# Patient Record
Sex: Female | Born: 1970 | Race: White | Hispanic: No | Marital: Married | Smoking: Former smoker
Health system: Southern US, Community
[De-identification: ages and names within clinical notes are randomized; demographics above are authoritative.]

## PROBLEM LIST (undated history)

## (undated) DIAGNOSIS — B379 Candidiasis, unspecified: Secondary | ICD-10-CM

## (undated) DIAGNOSIS — B1920 Unspecified viral hepatitis C without hepatic coma: Secondary | ICD-10-CM

## (undated) DIAGNOSIS — J020 Streptococcal pharyngitis: Secondary | ICD-10-CM

## (undated) DIAGNOSIS — N390 Urinary tract infection, site not specified: Secondary | ICD-10-CM

## (undated) HISTORY — PX: LEEP: SHX91

## (undated) HISTORY — PX: OTHER SURGICAL HISTORY: SHX169

## (undated) HISTORY — PX: GYNECOLOGIC CRYOSURGERY: SHX857

---

## 2017-12-03 ENCOUNTER — Other Ambulatory Visit: Payer: Self-pay

## 2017-12-03 ENCOUNTER — Emergency Department (HOSPITAL_COMMUNITY): Payer: Medicare Other

## 2017-12-03 ENCOUNTER — Encounter (HOSPITAL_COMMUNITY): Payer: Self-pay | Admitting: Emergency Medicine

## 2017-12-03 ENCOUNTER — Emergency Department (HOSPITAL_COMMUNITY)
Admission: EM | Admit: 2017-12-03 | Discharge: 2017-12-03 | Disposition: A | Discharge status: Home / Self Care | Payer: Medicare Other | Attending: Emergency Medicine | Admitting: Emergency Medicine

## 2017-12-03 DIAGNOSIS — R1012 Left upper quadrant pain: Secondary | ICD-10-CM | POA: Diagnosis not present

## 2017-12-03 DIAGNOSIS — Z87891 Personal history of nicotine dependence: Secondary | ICD-10-CM | POA: Insufficient documentation

## 2017-12-03 DIAGNOSIS — R112 Nausea with vomiting, unspecified: Secondary | ICD-10-CM | POA: Diagnosis not present

## 2017-12-03 DIAGNOSIS — R1013 Epigastric pain: Secondary | ICD-10-CM

## 2017-12-03 DIAGNOSIS — Z91040 Latex allergy status: Secondary | ICD-10-CM | POA: Diagnosis not present

## 2017-12-03 DIAGNOSIS — R1310 Dysphagia, unspecified: Secondary | ICD-10-CM | POA: Diagnosis not present

## 2017-12-03 DIAGNOSIS — R1011 Right upper quadrant pain: Secondary | ICD-10-CM | POA: Diagnosis not present

## 2017-12-03 HISTORY — DX: Candidiasis, unspecified: B37.9

## 2017-12-03 HISTORY — DX: Unspecified viral hepatitis C without hepatic coma: B19.20

## 2017-12-03 HISTORY — DX: Streptococcal pharyngitis: J02.0

## 2017-12-03 HISTORY — DX: Urinary tract infection, site not specified: N39.0

## 2017-12-03 LAB — CBC WITH DIFFERENTIAL/PLATELET
Abs Immature Granulocytes: 0 10*3/uL (ref 0.00–0.07)
Basophils Absolute: 0 10*3/uL (ref 0.0–0.1)
Basophils Relative: 1 %
EOS PCT: 2 %
Eosinophils Absolute: 0.1 10*3/uL (ref 0.0–0.5)
HEMATOCRIT: 32.2 % — AB (ref 36.0–46.0)
HEMOGLOBIN: 9.4 g/dL — AB (ref 12.0–15.0)
Immature Granulocytes: 0 %
LYMPHS PCT: 42 %
Lymphs Abs: 1.4 10*3/uL (ref 0.7–4.0)
MCH: 22.9 pg — AB (ref 26.0–34.0)
MCHC: 29.2 g/dL — AB (ref 30.0–36.0)
MCV: 78.3 fL — AB (ref 80.0–100.0)
MONO ABS: 0.4 10*3/uL (ref 0.1–1.0)
MONOS PCT: 13 %
Neutro Abs: 1.4 10*3/uL — ABNORMAL LOW (ref 1.7–7.7)
Neutrophils Relative %: 42 %
Platelets: 198 10*3/uL (ref 150–400)
RBC: 4.11 MIL/uL (ref 3.87–5.11)
RDW: 15.6 % — ABNORMAL HIGH (ref 11.5–15.5)
WBC: 3.3 10*3/uL — AB (ref 4.0–10.5)
nRBC: 0 % (ref 0.0–0.2)

## 2017-12-03 LAB — COMPREHENSIVE METABOLIC PANEL
ALK PHOS: 47 U/L (ref 38–126)
ALT: 24 U/L (ref 0–44)
ANION GAP: 4 — AB (ref 5–15)
AST: 30 U/L (ref 15–41)
Albumin: 4.1 g/dL (ref 3.5–5.0)
BILIRUBIN TOTAL: 0.8 mg/dL (ref 0.3–1.2)
BUN: 12 mg/dL (ref 6–20)
CALCIUM: 9.1 mg/dL (ref 8.9–10.3)
CO2: 27 mmol/L (ref 22–32)
CREATININE: 0.52 mg/dL (ref 0.44–1.00)
Chloride: 106 mmol/L (ref 98–111)
Glucose, Bld: 87 mg/dL (ref 70–99)
Potassium: 4.1 mmol/L (ref 3.5–5.1)
SODIUM: 137 mmol/L (ref 135–145)
TOTAL PROTEIN: 7.3 g/dL (ref 6.5–8.1)

## 2017-12-03 LAB — URINALYSIS, ROUTINE W REFLEX MICROSCOPIC
BILIRUBIN URINE: NEGATIVE
Glucose, UA: NEGATIVE mg/dL
Hgb urine dipstick: NEGATIVE
KETONES UR: NEGATIVE mg/dL
Leukocytes, UA: NEGATIVE
NITRITE: NEGATIVE
Protein, ur: NEGATIVE mg/dL
Specific Gravity, Urine: 1.019 (ref 1.005–1.030)
pH: 5 (ref 5.0–8.0)

## 2017-12-03 LAB — LIPASE, BLOOD: LIPASE: 29 U/L (ref 11–51)

## 2017-12-03 LAB — POC OCCULT BLOOD, ED: Fecal Occult Bld: NEGATIVE

## 2017-12-03 LAB — I-STAT BETA HCG BLOOD, ED (MC, WL, AP ONLY)

## 2017-12-03 MED ORDER — OMEPRAZOLE 20 MG PO CPDR: 20.0000 mg | DELAYED_RELEASE_CAPSULE | Freq: Every day | ORAL | 0 refills | Status: DC

## 2017-12-03 MED ORDER — LIDOCAINE VISCOUS HCL 2 % MT SOLN
15.0000 mL | Freq: Once | OROMUCOSAL | Status: AC
  Administered 2017-12-03: 15 mL via ORAL
  Filled 2017-12-03: qty 15

## 2017-12-03 MED ORDER — SUCRALFATE 1 GM/10ML PO SUSP: 1.0000 g | Freq: Three times a day (TID) | ORAL | 0 refills | Status: DC

## 2017-12-03 MED ORDER — ALUM & MAG HYDROXIDE-SIMETH 200-200-20 MG/5ML PO SUSP
30.0000 mL | Freq: Once | ORAL | Status: AC
  Administered 2017-12-03: 30 mL via ORAL
  Filled 2017-12-03: qty 30

## 2017-12-03 NOTE — ED Triage Notes (Signed)
Pt c/o pain to LUQ for few weeks but yesterday started pretty bad with pain worse with eating. Had difficulty swallowing started yesterday but denies sore throat. No oral swelling noted. N/v yesterday but none today. lnbm yesterday.

## 2017-12-03 NOTE — ED Provider Notes (Signed)
[facility] EMERGENCY DEPARTMENT Provider Note   CSN: [id number] Arrival date & time: 12/03/17  1045     History   Chief Complaint Chief Complaint  Patient presents with  . Abdominal Pain    HPI Theresa Brooks is a 47 y.o. female with a hx of hepatitis C (s/p tx w/ resolution 1 year prior) who is also s/p tubal ligation who presents to the ED with complaints of abdominal pain for the past 2-3 weeks, worse over past 24 hours. Patient describes the pain as being to the epigastric/LUQ area. She states initially it would only occur with consumption of food/fluids, however over past 24 hours it has seemed to be constant. Pain is a dull/aching sensation w/ occasional associated dyspepsia and sensation of difficulty swallowing mid chest, however able to fully swallow. She reports associated nausea yesterday w/ 1 episode of non bloody/non bilious emesis, able to tolerate PO pineapple juice and kale this AM. Pain currently is a 5/10 in severity, worse with PO intake, somewhat alleviated with position changes initially, but no longer helpful. Has tried Tums without relief. Denies fever, chest pain, dyspnea, hematemesis, hematochezia, melena, diarrhea, dysuria, vaginal bleeding, or vaginal discharge. Denies recent increase in NSAIDs/ETOH. Patient states she is a vegetarian.   HPI  Past Medical History:  Diagnosis Date  . Hepatitis C   . Strep throat   . UTI (urinary tract infection)   . Yeast infection     There are no active problems to display for this patient.   Past Surgical History:  Procedure Laterality Date  . tubal       OB History    Gravida  3   Para      Term      Preterm      AB      Living  3     SAB      TAB      Ectopic      Multiple      Live Births               Home Medications    Prior to Admission medications   Not on File    Family History History reviewed. No pertinent family history.  Social History Social History   Tobacco  Use  . Smoking status: Former Games developer  . Smokeless tobacco: Never Used  Substance Use Topics  . Alcohol use: Never    Frequency: Never  . Drug use: Not Currently    Comment: used to     Allergies   Bactrim [sulfamethoxazole-trimethoprim]; Doxycycline; and Latex   Review of Systems Review of Systems  Constitutional: Negative for chills and fever.  HENT: Positive for trouble swallowing ("feels like it gets stuck mid way in the chest" but able). Negative for sore throat.   Respiratory: Negative for shortness of breath.   Cardiovascular: Negative for chest pain.  Gastrointestinal: Positive for abdominal pain, nausea (not at present) and vomiting (1 episode). Negative for abdominal distention, anal bleeding, blood in stool, constipation, diarrhea and rectal pain.  Genitourinary: Negative for decreased urine volume, dysuria, vaginal bleeding and vaginal discharge.  All other systems reviewed and are negative.    Physical Exam Updated Vital Signs BP (!) 148/97 (BP Location: Right Arm)   Pulse 72   Temp 97.7 F (36.5 C) (Oral)   Resp 17   LMP 11/21/2017   SpO2 100%   Physical Exam  Constitutional: She appears well-developed and well-nourished.  Non-toxic appearance. No distress.  HENT:  Head: Normocephalic and atraumatic.  Eyes: Conjunctivae are normal. Right eye exhibits no discharge. Left eye exhibits no discharge.  Neck: Neck supple.  Cardiovascular: Normal rate and regular rhythm.  Pulmonary/Chest: Effort normal and breath sounds normal. No respiratory distress. She has no wheezes. She has no rhonchi. She has no rales.  Respiration even and unlabored  Abdominal: Soft. She exhibits no distension. There is no hepatosplenomegaly. There is tenderness (very mild to epigastrium/LUQ). There is no rigidity, no rebound, no guarding, no CVA tenderness, no tenderness at McBurney's point and negative Murphy's sign.  Genitourinary: Rectal exam shows no external hemorrhoid, no internal  hemorrhoid, no fissure, no tenderness and guaiac negative stool. Pelvic exam was performed with patient in the knee-chest position.  Genitourinary Comments: EDT Vickie present as chaperone.   Neurological: She is alert.  Clear speech.   Skin: Skin is warm and dry. No rash noted.  Psychiatric: She has a normal mood and affect. Her behavior is normal.  Nursing note and vitals reviewed.    ED Treatments / Results  Labs (all labs ordered are listed, but only abnormal results are displayed) Labs Reviewed  CBC WITH DIFFERENTIAL/PLATELET - Abnormal; Notable for the following components:      Result Value   WBC 3.3 (*)    Hemoglobin 9.4 (*)    HCT 32.2 (*)    MCV 78.3 (*)    MCH 22.9 (*)    MCHC 29.2 (*)    RDW 15.6 (*)    Neutro Abs 1.4 (*)    All other components within normal limits  COMPREHENSIVE METABOLIC PANEL - Abnormal; Notable for the following components:   Anion gap 4 (*)    All other components within normal limits  URINALYSIS, ROUTINE W REFLEX MICROSCOPIC - Abnormal; Notable for the following components:   APPearance HAZY (*)    All other components within normal limits  LIPASE, BLOOD  I-STAT BETA HCG BLOOD, ED ([facility], [facility], [facility] ONLY)  POC OCCULT BLOOD, ED    EKG EKG Interpretation  Date/Time:  Friday December 03 2017 11:46:17 EDT Ventricular Rate:  56 PR Interval:    QRS Duration: 108 QT Interval:  441 QTC Calculation: 426 R Axis:   66 Text Interpretation:  Sinus rhythm Low voltage, precordial leads Anteroseptal infarct, old Confirmed by Raeford Razor [id number]) on 12/03/2017 12:58:07 PM   Radiology Dg Abdomen Acute W/chest  Result Date: 12/03/2017 CLINICAL DATA:  LEFT and RIGHT upper quadrant pain with difficulty swallowing, nausea and vomiting for 2 weeks. EXAM: DG ABDOMEN ACUTE W/ 1V CHEST COMPARISON:  None. FINDINGS: Single-view of the chest: Heart size and mediastinal contours are within normal limits. Lungs are clear. No pleural effusion or pneumothorax seen.  Osseous structures about the chest are unremarkable. Supine and upright views of the abdomen: Bowel gas pattern is nonobstructive. No evidence of soft tissue mass or abnormal fluid collection. No evidence of free intraperitoneal air. No acute or suspicious osseous finding. Small calcifications within the lower pelvis, vascular versus ureteral calculi IMPRESSION: 1. Lungs are clear.  No evidence of pneumonia or pulmonary edema. 2. Nonobstructive bowel gas pattern. Moderate amount of stool in the RIGHT colon (constipation?). 3. Small calcifications within the lower pelvis, likely vascular but distal ureteral calculus cannot be excluded if any hematuria or symptoms of ureteral colic. Electronically Signed   By: Bary Richard M.D.   On: 12/03/2017 13:53    Procedures Procedures (including critical care time)  Medications Ordered in ED Medications - No data  to display   Initial Impression / Assessment and Plan / ED Course  I have reviewed the triage vital signs and the nursing notes.  Pertinent labs & imaging results that were available during my care of the patient were reviewed by me and considered in my medical decision making (see chart for details).    Patient presents to the ED with complaints of abdominal pain. Patient nontoxic appearing, in no apparent distress, vitals WNL other than elevated BP, doubt HTN emergency. On exam patient very mildly tender to epigastric/LUQ area, no peritoneal signs. Will evaluate with labs, EKG, and Xray. Trial GI cocktail.   Labs reviewed: No significant electrolyte derangements. LFTs, renal function, and lipase WNL. Urinalysis without obvious infection. Leukopenia at 3.3 which will require PCP recheck, upon review of care everywhere tab it appears WBC has ranged from 3.7-6.3 over past few years. Her hgb/hct are lower than prior on record. Labs today with hgb of 9.4 and hct of 32.2 w/ findings consistent of likely iron deficiency anemia. Most recent on record are  12.1 and 36.1 respectively from just over 1 year ago. Fecal occult blood subsequently obtained and negative, no gross melena, do not suspect acute GI bleed.   Chest/abdominal Xra w/ nonobstructive bowel gas pattern. Moderate amount of stool in the RIGHT colon (constipation?). There is also mention of small calcifications within the lower pelvis, likely vascular but distal ureteral calculus cannot be excluded if any hematuria or symptoms of ureteral colic- do not suspect nephrolithiasis based on UA and presentation.   Patient afebrile with negative Murphy's, no RUQ tenderness, doubt cholecystitis. Lipase WNL, doubt pancreatitis. No evidence of obvious obstruction/perforation on Xray, having normal bowel movements and tolerating PO therefore feel these diagnoses would be less likely. No McBurneys point tenderness or tenderness to lower quadrants, doubt appendicitis. Pain is in the upper abdomen making PID/ectopic less likely. EKG without STEMI to suggest ACS.   On repeat abdominal exam patient remains without peritoneal signs, she is tolerating PO in the emergency department and had some improvement with GI cocktail. High suspicion for PUD vs. GERD. Will treat with PPI and carafate. Will have patient follow up with GI given her overall sxs, given sensation of something being stuck mid chest, no evidence of obstruction on imaging today- may benefit from EGD or further eval per GI . I discussed results, treatment plan, need for follow-up, and return precautions with the patient. Provided opportunity for questions, patient confirmed understanding and is in agreement with plan.    Final Clinical Impressions(s) / ED Diagnoses   Final diagnoses:  Epigastric pain    ED Discharge Orders         Ordered    sucralfate (CARAFATE) 1 GM/10ML suspension  3 times daily with meals & bedtime     12/03/17 1415    omeprazole (PRILOSEC) 20 MG capsule  Daily     12/03/17 1415           Cherly Anderson,  PA-C 12/03/17 1423    Raeford Razor, MD 12/03/17 1453

## 2017-12-03 NOTE — Discharge Instructions (Addendum)
You were seen in the emergency department today for abdominal pain.  Your work-up in the emergency department was overall reassuring.  Your hemoglobin is 9.4, this is lower than usual for you, this is something that should be rechecked by her primary care provider within the next week or so.  Blood cell count is also somewhat low at 3.3, this should also be rechecked at that time.  We suspect symptoms may be due to peptic ulcer disease and or a combination of GERD, please see the attached handouts.  There are additional diagnoses that could be causing the symptoms, for this reason we would like you to follow-up with a GI doctor in the next 3 to 5 days for reevaluation and possible further assessment.  We are sending you home with 2 medications to take in the meantime: -Omeprazole: This is a medicine to help prevent acid buildup in the stomach, please take this each morning. -Carafate: At this is an antiulcer medication to help with acidity, please take this prior to each meal and prior to going to bed at night.  We have prescribed you new medication(s) today. Discuss the medications prescribed today with your pharmacist as they can have adverse effects and interactions with your other medicines including over the counter and prescribed medications. Seek medical evaluation if you start to experience new or abnormal symptoms after taking one of these medicines, seek care immediately if you start to experience difficulty breathing, feeling of your throat closing, facial swelling, or rash as these could be indications of a more serious allergic reaction  Please follow-up with GI as discussed above.  Return to the ER anytime for new or worsening symptoms or any other concerns including but not limited to worsening pain, inability to keep fluids down, fever, blood in your stool, or any other concerns that you may have.   Below is also a list of primary care providers in the area to be able to establish care  here.  [facility] Primary Care Doctor List    Kari Baars MD. Specialty: Pulmonary Disease Contact information: [address]  [address]  [place] [address]  [phone]   Syliva Overman, MD. Specialty: [facility] Medicine Contact information: [address]  [place] [address]  [phone]   Lilyan Punt, MD. Specialty: [facility] Medicine Contact information: [address]  [place] [address]  [phone]   Avon Gully, MD Specialty: Internal Medicine Contact information: [address] [place] [address]  [phone]   Catalina Pizza, MD. Specialty: Internal Medicine Contact information: [address]  [place] [address]  [phone]    [facility] (Dr. Selena Batten) Specialty: Family Medicine Contact information: [address]  [place] [address]  [phone]   John Giovanni, MD. Specialty: [facility] Medicine Contact information: [address]  [address]  [place] [address]  [phone]   Carylon Perches, MD. Specialty: Internal Medicine Contact information: [address]  [address]  [place] [address]  [phone]    [facility]  [address] [place], [place] [address] [phone]  Services [facility] offers a variety of basic health services.  Services include but are not limited to: Blood pressure checks  Heart rate checks  Blood sugar checks  Urine analysis  Rapid strep tests  Pregnancy tests.  Health education and referrals  People needing more  complex services will be directed to a physician online. Using these virtual visits, doctors can evaluate and prescribe medicine and treatments. There will be no medication on-site, though [facility] will help patients fill their prescriptions at little to no cost.   For More information please go  to: [email or web address]

## 2017-12-17 ENCOUNTER — Encounter (HOSPITAL_COMMUNITY): Payer: Self-pay

## 2017-12-17 ENCOUNTER — Emergency Department (HOSPITAL_COMMUNITY)
Admission: EM | Admit: 2017-12-17 | Discharge: 2017-12-17 | Disposition: A | Discharge status: Home / Self Care | Payer: Medicare Other | Attending: Emergency Medicine | Admitting: Emergency Medicine

## 2017-12-17 ENCOUNTER — Other Ambulatory Visit: Payer: Self-pay

## 2017-12-17 DIAGNOSIS — Z87891 Personal history of nicotine dependence: Secondary | ICD-10-CM | POA: Insufficient documentation

## 2017-12-17 DIAGNOSIS — Z760 Encounter for issue of repeat prescription: Secondary | ICD-10-CM

## 2017-12-17 DIAGNOSIS — Z79899 Other long term (current) drug therapy: Secondary | ICD-10-CM | POA: Diagnosis not present

## 2017-12-17 DIAGNOSIS — R1013 Epigastric pain: Secondary | ICD-10-CM | POA: Diagnosis not present

## 2017-12-17 DIAGNOSIS — N760 Acute vaginitis: Secondary | ICD-10-CM | POA: Diagnosis present

## 2017-12-17 MED ORDER — SUCRALFATE 1 GM/10ML PO SUSP: 1.0000 g | Freq: Three times a day (TID) | ORAL | 0 refills | Status: AC

## 2017-12-17 MED ORDER — SUCRALFATE 1 G PO TABS: 1.0000 g | ORAL_TABLET | Freq: Three times a day (TID) | ORAL | 0 refills | Status: DC

## 2017-12-17 NOTE — ED Triage Notes (Signed)
Pt was 11/1 for  Left upper quad pain and was given a prescription for carafate. Pt reports unable to be seen by Dr Jena Gaussourk per his office pt has been seen by GI in [place] in past. Pt reports upper abd pain returned last night

## 2017-12-17 NOTE — ED Provider Notes (Signed)
[facility] EMERGENCY DEPARTMENT Provider Note   CSN: [id number] Arrival date & time: 12/17/17  1148     History   Chief Complaint Chief Complaint  Patient presents with  . Medication Refill    HPI Theresa Brooks is a 47 y.o. female.  Theresa Brooks is a 47 y.o. Female with a history of hep C, strep throat and UTIs, who presents to the emergency department requesting medication refill.  She reports that she was seen on 11/1 in the emergency department and evaluated for epigastric and left upper quadrant abdominal pain, had reassuring work-up, and was provided with prescription for Carafate which she has been taking over the past 2 weeks and it has been significantly helping with her symptoms, she takes one with every meal, but ran out of this, 8 a meal this morning without it and had the same discomfort again.  She is not taking any other medications to manage her symptoms.  She was referred to Dr. Jena Gauss with GI, but they reported that they would not be able to see her since she had seen a GI doctor in [place] in the past.  Patient does have an upcoming appointment with a primary care doctor on Monday but is requesting a refill of Carafate in the meantime to help manage her symptoms with meals.  Reports a constant epigastric pain after eating which is not worse with palpation she has not had any nausea, vomiting, hematemesis, diarrhea, melena or hematochezia.  No fevers or chills.  No chest pain or shortness of breath.     Past Medical History:  Diagnosis Date  . Hepatitis C   . Strep throat   . UTI (urinary tract infection)   . Yeast infection     There are no active problems to display for this patient.   Past Surgical History:  Procedure Laterality Date  . tubal       OB History    Gravida  3   Para      Term      Preterm      AB      Living  3     SAB      TAB      Ectopic      Multiple      Live Births               Home Medications     Prior to Admission medications   Medication Sig Start Date End Date Taking? Authorizing Provider  omeprazole (PRILOSEC) 20 MG capsule Take 1 capsule (20 mg total) by mouth daily. 12/03/17   Petrucelli, Samantha R, PA-C  sucralfate (CARAFATE) 1 GM/10ML suspension Take 10 mLs (1 g total) by mouth 4 (four) times daily -  with meals and at bedtime. 12/17/17   Dartha Lodge, PA-C    Family History No family history on file.  Social History Social History   Tobacco Use  . Smoking status: Former Games developer  . Smokeless tobacco: Never Used  Substance Use Topics  . Alcohol use: Never    Frequency: Never  . Drug use: Not Currently    Comment: used to     Allergies   Bactrim [sulfamethoxazole-trimethoprim] and Latex   Review of Systems Review of Systems  Constitutional: Negative for chills and fever.  HENT: Negative.   Eyes: Negative for visual disturbance.  Respiratory: Negative for cough and shortness of breath.   Cardiovascular: Negative for chest pain.  Gastrointestinal: Positive for abdominal pain. Negative  for blood in stool, nausea and vomiting.  All other systems reviewed and are negative.    Physical Exam Updated Vital Signs BP (!) 145/93   Pulse 73   Temp 98 F (36.7 C)   Resp 20   LMP 12/16/2017   SpO2 100%   Physical Exam  Constitutional: She appears well-developed and well-nourished. No distress.  HENT:  Head: Normocephalic and atraumatic.  Eyes: Right eye exhibits no discharge. Left eye exhibits no discharge.  Neck: Neck supple.  Pulmonary/Chest: Effort normal. No respiratory distress.  Abdominal: Soft. Bowel sounds are normal. She exhibits no distension and no mass. There is no tenderness. There is no guarding.  Abdomen soft, nondistended, nontender to palpation in all quadrants without guarding or peritoneal signs  Musculoskeletal: She exhibits no deformity.  Neurological: She is alert. Coordination normal.  Skin: Skin is warm and dry. She is not  diaphoretic.  Psychiatric: She has a normal mood and affect. Her behavior is normal.  Nursing note and vitals reviewed.    ED Treatments / Results  Labs (all labs ordered are listed, but only abnormal results are displayed) Labs Reviewed - No data to display  EKG None  Radiology No results found.  Procedures Procedures (including critical care time)  Medications Ordered in ED Medications - No data to display   Initial Impression / Assessment and Plan / ED Course  I have reviewed the triage vital signs and the nursing notes.  Pertinent labs & imaging results that were available during my care of the patient were reviewed by me and considered in my medical decision making (see chart for details).  Who presents to the emergency department for medication refill.  She reports that she was seen 2 weeks ago and worked up for left upper quadrant pain, had reassuring work-up and was discharged with Carafate and Prilosec and given follow-up with GI, she was not able to see the GI doctor but does have an upcoming appointment with a new PCP.  Reports Carafate has been significantly improving her symptoms but she ran out of it and after trying to eat a meal without it today started having epigastric pain again.  She has not been taking the Prilosec that was prescribed, I encouraged her to try using this as well, will provide refill of Carafate.  Return precautions discussed.  At this time feel patient is stable for discharge home.  She expresses understanding and is in agreement with plan.  Final Clinical Impressions(s) / ED Diagnoses   Final diagnoses:  Medication refill  Epigastric pain    ED Discharge Orders         Ordered    sucralfate (CARAFATE) 1 GM/10ML suspension  3 times daily with meals & bedtime     12/17/17 1319           Dartha LodgeFord,  N, PA-C 12/17/17 1322    Samuel JesterMcManus, Kathleen, DO 12/20/17 2215

## 2017-12-17 NOTE — Discharge Instructions (Signed)
Take Carafate and Prilosec as directed, follow-up with your primary care doctor, hopefully they will be able to refer you to a GI doctor that will be able to see you.  Return to the emergency department for significantly worsened abdominal pain, vomiting, diarrhea, blood in the stool, fevers or any other new or concerning symptoms.

## 2017-12-20 ENCOUNTER — Other Ambulatory Visit (HOSPITAL_COMMUNITY)
Admission: RE | Admit: 2017-12-20 | Discharge: 2017-12-20 | Disposition: A | Discharge status: Home / Self Care | Payer: Medicare Other | Source: Ambulatory Visit | Attending: Internal Medicine | Admitting: Internal Medicine

## 2017-12-20 DIAGNOSIS — M1990 Unspecified osteoarthritis, unspecified site: Secondary | ICD-10-CM | POA: Diagnosis not present

## 2017-12-20 DIAGNOSIS — K739 Chronic hepatitis, unspecified: Secondary | ICD-10-CM | POA: Diagnosis not present

## 2017-12-20 DIAGNOSIS — K219 Gastro-esophageal reflux disease without esophagitis: Secondary | ICD-10-CM | POA: Diagnosis not present

## 2017-12-20 DIAGNOSIS — I10 Essential (primary) hypertension: Secondary | ICD-10-CM | POA: Diagnosis not present

## 2017-12-20 DIAGNOSIS — K279 Peptic ulcer, site unspecified, unspecified as acute or chronic, without hemorrhage or perforation: Secondary | ICD-10-CM | POA: Diagnosis not present

## 2017-12-20 DIAGNOSIS — D649 Anemia, unspecified: Secondary | ICD-10-CM | POA: Diagnosis not present

## 2017-12-20 LAB — BASIC METABOLIC PANEL
Anion gap: 9 (ref 5–15)
BUN: 12 mg/dL (ref 6–20)
CHLORIDE: 106 mmol/L (ref 98–111)
CO2: 21 mmol/L — ABNORMAL LOW (ref 22–32)
Calcium: 9 mg/dL (ref 8.9–10.3)
Creatinine, Ser: 0.58 mg/dL (ref 0.44–1.00)
GFR calc non Af Amer: >60 mL/min (ref >60)
Glucose, Bld: 76 mg/dL (ref 70–99)
POTASSIUM: 3.2 mmol/L — AB (ref 3.5–5.1)
SODIUM: 136 mmol/L (ref 135–145)

## 2017-12-20 LAB — CBC WITH DIFFERENTIAL/PLATELET
Abs Immature Granulocytes: 0 10*3/uL (ref 0.00–0.07)
Basophils Absolute: 0 10*3/uL (ref 0.0–0.1)
Basophils Relative: 1 %
EOS ABS: 0.1 10*3/uL (ref 0.0–0.5)
EOS PCT: 2 %
HEMATOCRIT: 35 % — AB (ref 36.0–46.0)
Hemoglobin: 10 g/dL — ABNORMAL LOW (ref 12.0–15.0)
Immature Granulocytes: 0 %
LYMPHS ABS: 1.7 10*3/uL (ref 0.7–4.0)
Lymphocytes Relative: 36 %
MCH: 22.9 pg — ABNORMAL LOW (ref 26.0–34.0)
MCHC: 28.6 g/dL — AB (ref 30.0–36.0)
MCV: 80.1 fL (ref 80.0–100.0)
MONOS PCT: 9 %
Monocytes Absolute: 0.4 10*3/uL (ref 0.1–1.0)
Neutro Abs: 2.4 10*3/uL (ref 1.7–7.7)
Neutrophils Relative %: 52 %
Platelets: 235 10*3/uL (ref 150–400)
RBC: 4.37 MIL/uL (ref 3.87–5.11)
RDW: 15.9 % — AB (ref 11.5–15.5)
WBC: 4.6 10*3/uL (ref 4.0–10.5)
nRBC: 0 % (ref 0.0–0.2)

## 2017-12-20 LAB — LIPID PANEL
CHOL/HDL RATIO: 3.5 ratio
CHOLESTEROL: 173 mg/dL (ref 0–200)
HDL: 50 mg/dL (ref >40)
LDL Cholesterol: 106 mg/dL — ABNORMAL HIGH (ref 0–99)
TRIGLYCERIDES: 86 mg/dL (ref <150)
VLDL: 17 mg/dL (ref 0–40)

## 2017-12-20 LAB — HEPATIC FUNCTION PANEL
ALBUMIN: 4.9 g/dL (ref 3.5–5.0)
ALK PHOS: 56 U/L (ref 38–126)
ALT: 20 U/L (ref 0–44)
AST: 32 U/L (ref 15–41)
Bilirubin, Direct: 0.1 mg/dL (ref 0.0–0.2)
Indirect Bilirubin: 0.5 mg/dL (ref 0.3–0.9)
TOTAL PROTEIN: 8.9 g/dL — AB (ref 6.5–8.1)
Total Bilirubin: 0.6 mg/dL (ref 0.3–1.2)

## 2017-12-21 LAB — HEMOGLOBIN A1C
Hgb A1c MFr Bld: 4.9 % (ref 4.8–5.6)
MEAN PLASMA GLUCOSE: 93.93 mg/dL

## 2017-12-27 DIAGNOSIS — M1990 Unspecified osteoarthritis, unspecified site: Secondary | ICD-10-CM | POA: Diagnosis not present

## 2017-12-27 DIAGNOSIS — Z1331 Encounter for screening for depression: Secondary | ICD-10-CM | POA: Diagnosis not present

## 2017-12-27 DIAGNOSIS — D649 Anemia, unspecified: Secondary | ICD-10-CM | POA: Diagnosis not present

## 2017-12-27 DIAGNOSIS — Z1389 Encounter for screening for other disorder: Secondary | ICD-10-CM | POA: Diagnosis not present

## 2017-12-27 DIAGNOSIS — K279 Peptic ulcer, site unspecified, unspecified as acute or chronic, without hemorrhage or perforation: Secondary | ICD-10-CM | POA: Diagnosis not present

## 2017-12-28 ENCOUNTER — Other Ambulatory Visit (HOSPITAL_COMMUNITY): Payer: Self-pay | Admitting: Internal Medicine

## 2017-12-28 DIAGNOSIS — Z1231 Encounter for screening mammogram for malignant neoplasm of breast: Secondary | ICD-10-CM

## 2018-01-03 ENCOUNTER — Ambulatory Visit (HOSPITAL_COMMUNITY): Payer: Medicare Other

## 2018-01-18 ENCOUNTER — Ambulatory Visit (INDEPENDENT_AMBULATORY_CARE_PROVIDER_SITE_OTHER): Payer: Medicare Other | Admitting: Internal Medicine

## 2018-02-07 ENCOUNTER — Encounter: Payer: Self-pay | Admitting: Obstetrics and Gynecology

## 2018-02-07 ENCOUNTER — Other Ambulatory Visit (HOSPITAL_COMMUNITY)
Admission: RE | Admit: 2018-02-07 | Discharge: 2018-02-07 | Disposition: A | Discharge status: Home / Self Care | Payer: Medicare Other | Source: Ambulatory Visit | Attending: Obstetrics and Gynecology | Admitting: Obstetrics and Gynecology

## 2018-02-07 ENCOUNTER — Ambulatory Visit (INDEPENDENT_AMBULATORY_CARE_PROVIDER_SITE_OTHER): Payer: Medicare Other | Admitting: Obstetrics and Gynecology

## 2018-02-07 VITALS — BP 144/92 | HR 82 | Ht 69.0 in | Wt 182.0 lb

## 2018-02-07 DIAGNOSIS — Z124 Encounter for screening for malignant neoplasm of cervix: Secondary | ICD-10-CM | POA: Diagnosis not present

## 2018-02-07 DIAGNOSIS — B354 Tinea corporis: Secondary | ICD-10-CM | POA: Diagnosis not present

## 2018-02-07 MED ORDER — NYSTATIN 100000 UNIT/GM EX POWD: Freq: Two times a day (BID) | CUTANEOUS | 4 refills | Status: AC

## 2018-02-07 NOTE — Progress Notes (Addendum)
Patient ID: Theresa Brooks, female   DOB: [birth year], 48 y.o.   MRN: [id number]  Assessment:  Annual Gyn Exam Folliculitis, patient is skeptical of diagnosis, and will notify us if she desires antibacterial treatment. Tinea Corpus Plan:  1. pap smear done, next pap due 3 years 2. Rx Nystatin powder and ketoconazole cream 3    Annual mammogram advised after age 59, f/u prn Subjective:  Theresa Brooks is a 48 y.o. female G3P0 who presents for annual exam. No LMP recorded. The patient has complaints today of skin lesions that she has had for years. As used creams, the lesions go away but always return, denies getting them under the breast but in groin area and under arms. Notes that's if she takes antibiotics it makes symptoms much worse. Says she ordered Diflucan on the Internet but didn't work. Says she can't eat sweet fruits or she will get yeast infection   Has heavy periods with a fibroid tumor. She has minimal anemia but notes she east a healthy diet. Has tubal.  The following portions of the patient's history were reviewed and updated as appropriate: allergies, current medications, past family history, past medical history, past social history, past surgical history and problem list. Past Medical History:  Diagnosis Date  . Hepatitis C   . Strep throat   . UTI (urinary tract infection)   . Yeast infection     Past Surgical History:  Procedure Laterality Date  . tubal       Current Outpatient Medications:  .  omeprazole (PRILOSEC) 20 MG capsule, Take 1 capsule (20 mg total) by mouth daily., Disp: 30 capsule, Rfl: 0 .  sucralfate (CARAFATE) 1 g tablet, Take 1 tablet (1 g total) by mouth 4 (four) times daily -  with meals and at bedtime for 15 days., Disp: 60 tablet, Rfl: 0 .  sucralfate (CARAFATE) 1 GM/10ML suspension, Take 10 mLs (1 g total) by mouth 4 (four) times daily -  with meals and at bedtime., Disp: 420 mL, Rfl: 0  Review of Systems Constitutional:  negative Gastrointestinal: negative Genitourinary: normal  Objective:  There were no vitals taken for this visit.   BMI: There is no height or weight on file to calculate BMI.  General Appearance: Alert, appropriate appearance for age. No acute distress HEENT: Grossly normal Neck / Thyroid:  Cardiovascular: RRR; normal S1, S2, no murmur Lungs: CTA bilaterally Back: No CVAT Breast Exam: Not Done Gastrointestinal: Soft, non-tender, no masses or organomegaly Pelvic Exam: EXTERNAL GENITALIA: inguinal crease on right 1 cm wide band with 3 folliculi with erythematous  draining sites  VAGINA: normal appearing vagina, no lesions,  CERVIX: multip, no discharge or lesions  UTERUS: uterus is normal  PAP: Pap smear done today. Lymphatic Exam: Non-palpable nodes in neck, clavicular, axillary, or inguinal regions Skin: no rash or abnormalities Neurologic: Normal gait and speech, no tremor  Psychiatric: Alert and oriented, appropriate affect.  Urinalysis:Not done  By signing my name below, I, Arnette Norris, attest that this documentation has been prepared under the direction and in the presence of Tilda Burrow, MD. Electronically Signed: Arnette Norris Medical Scribe. 02/07/18. 1:48 PM.  I personally performed the services described in this documentation, which was SCRIBED in my presence. The recorded information has been reviewed and considered accurate. It has been edited as necessary during review. Tilda Burrow, MD

## 2018-02-09 LAB — CYTOLOGY - PAP
Diagnosis: NEGATIVE
HPV (WINDOPATH): DETECTED — AB

## 2018-02-10 ENCOUNTER — Ambulatory Visit (INDEPENDENT_AMBULATORY_CARE_PROVIDER_SITE_OTHER): Payer: Medicare Other | Admitting: Internal Medicine

## 2018-02-11 ENCOUNTER — Telehealth: Payer: Self-pay | Admitting: *Deleted

## 2018-02-11 NOTE — Telephone Encounter (Signed)
Patient informed normal pap cytology, but HPV virus present. So followup is 1 yr for a pap,not 3 yrs. Verbalized understanding with no further questions.

## 2020-01-27 IMAGING — DX DG ABDOMEN ACUTE W/ 1V CHEST
3 series · 3 of 3 positions shown · non-contrast
Comparison: None.

CLINICAL DATA: LEFT and RIGHT upper quadrant pain with difficulty
swallowing, nausea and vomiting for 2 weeks.

EXAM:
DG ABDOMEN ACUTE W/ 1V CHEST

[chest pa]
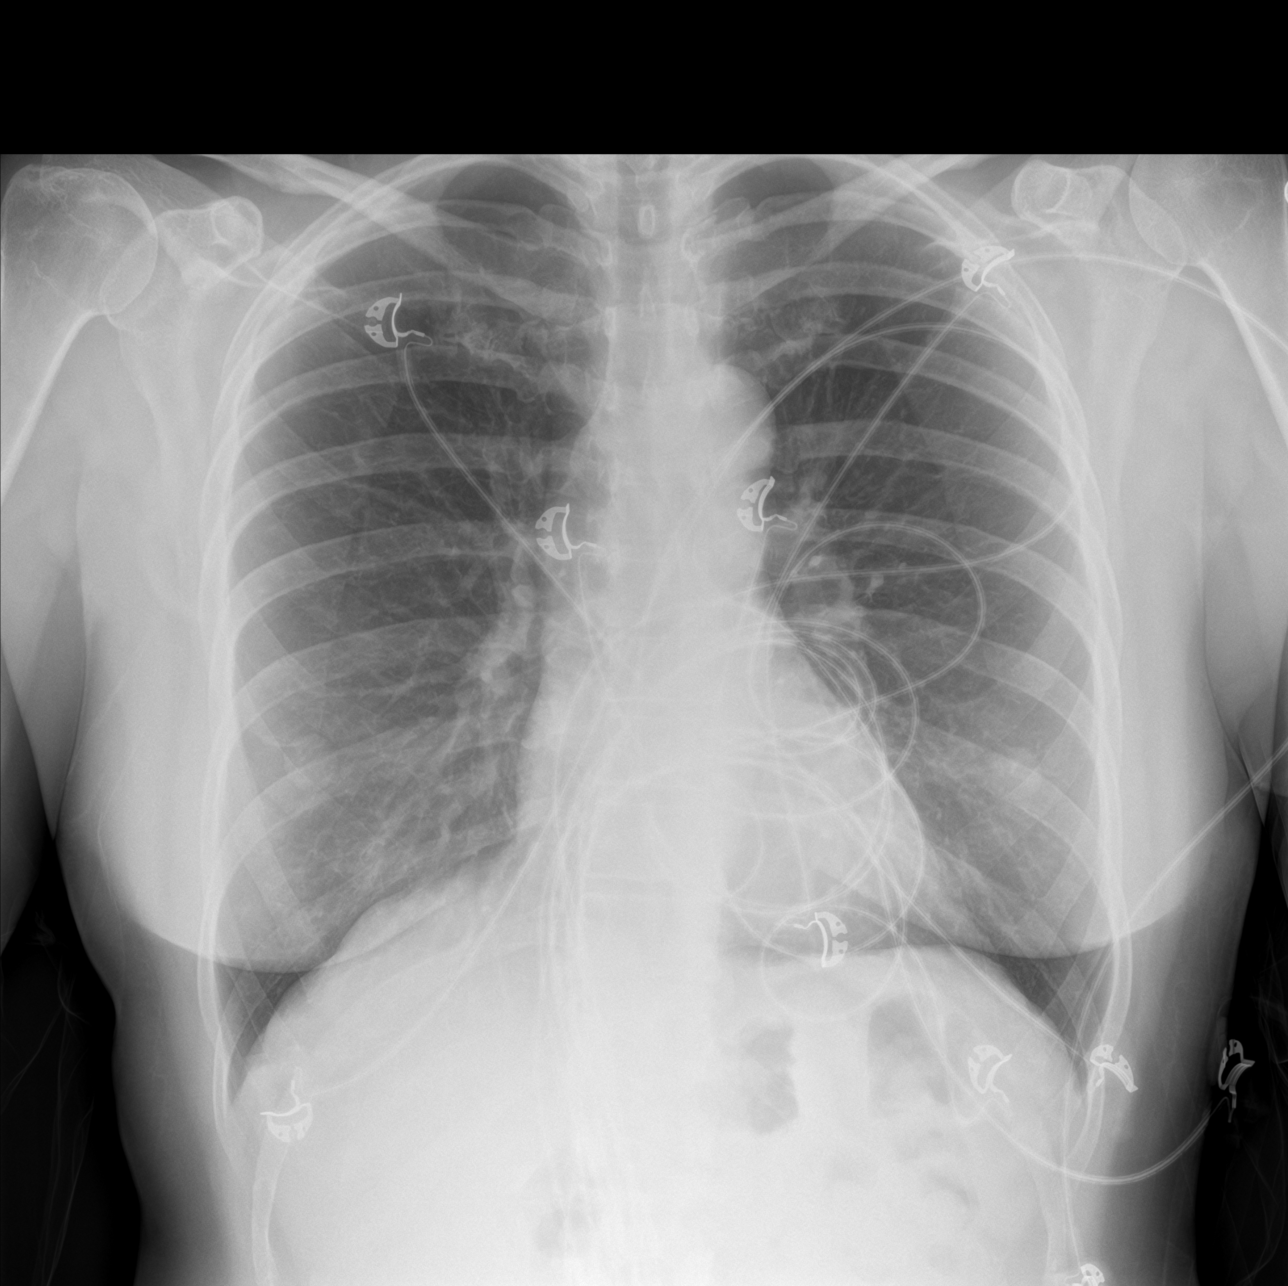

[abdomen erect]
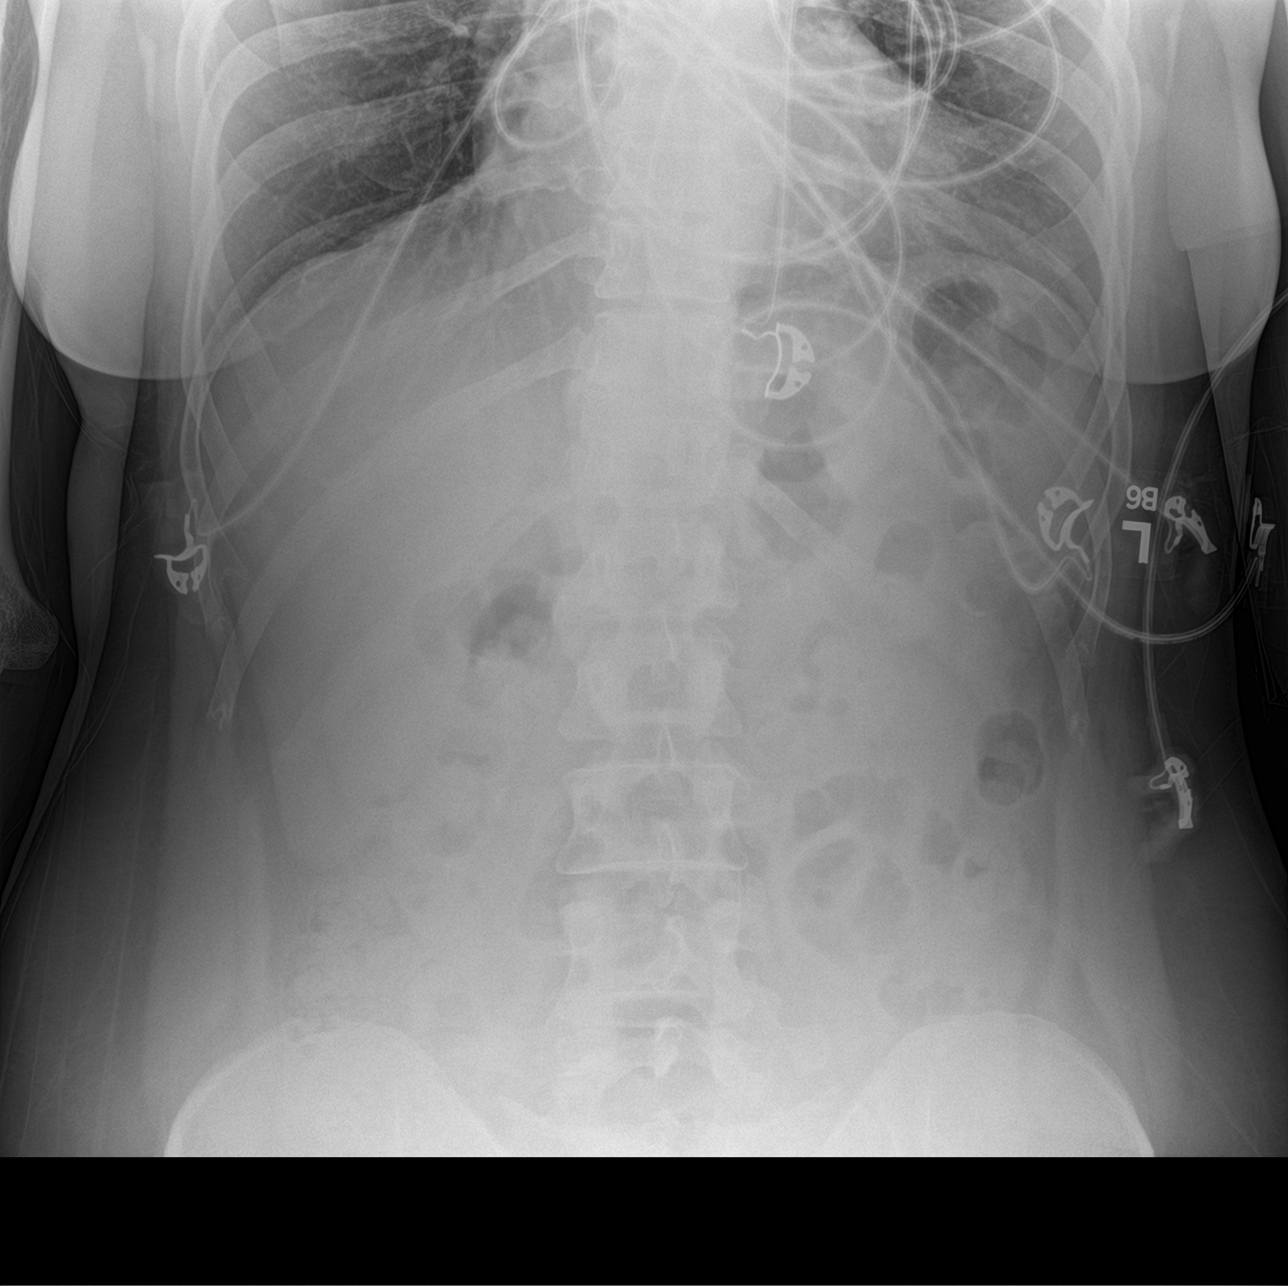

[abdomen supine]
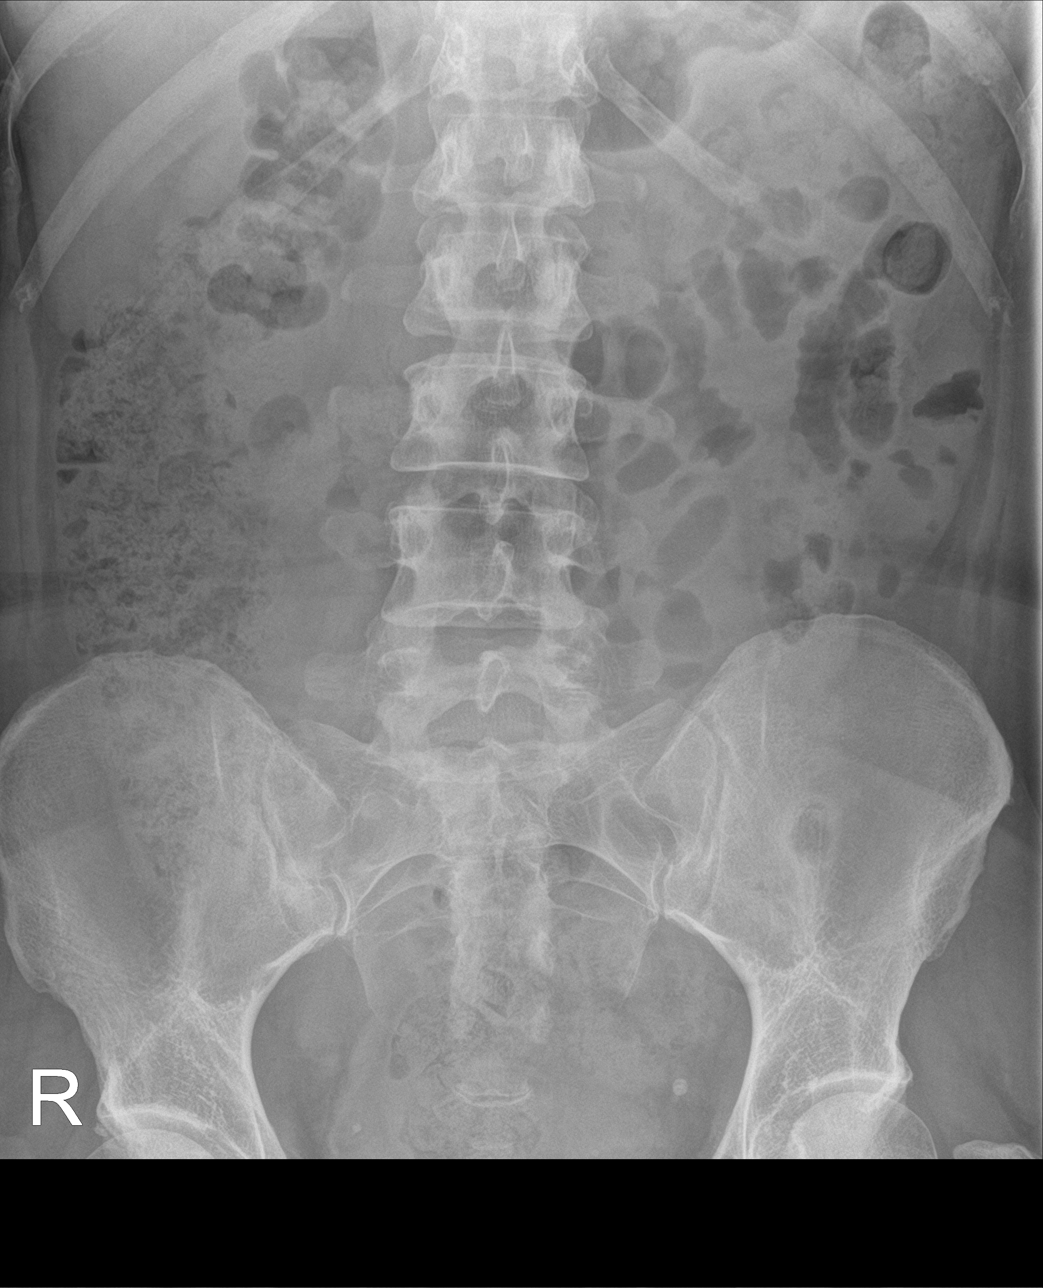

[3 of 3 positions shown; findings below may reference images not displayed]

FINDINGS: Single-view of the chest: Heart size and mediastinal contours are
within normal limits. Lungs are clear. No pleural effusion or
pneumothorax seen. Osseous structures about the chest are
unremarkable.

Supine and upright views of the abdomen: Bowel gas pattern is
nonobstructive. No evidence of soft tissue mass or abnormal fluid
collection. No evidence of free intraperitoneal air. No acute or
suspicious osseous finding. Small calcifications within the lower
pelvis, vascular versus ureteral calculi
IMPRESSION: 1. Lungs are clear.  No evidence of pneumonia or pulmonary edema.
2. Nonobstructive bowel gas pattern. Moderate amount of stool in the
RIGHT colon (constipation?).
3. Small calcifications within the lower pelvis, likely vascular but
distal ureteral calculus cannot be excluded if any hematuria or
symptoms of ureteral colic.
